# Patient Record
Sex: Female | Born: 1980 | Race: White | Hispanic: No | Marital: Married | Smoking: Never smoker
Health system: Southern US, Community
[De-identification: ages and names within clinical notes are randomized; demographics above are authoritative.]

## PROBLEM LIST (undated history)

## (undated) DIAGNOSIS — F329 Major depressive disorder, single episode, unspecified: Secondary | ICD-10-CM

## (undated) DIAGNOSIS — F32A Depression, unspecified: Secondary | ICD-10-CM

## (undated) HISTORY — DX: Major depressive disorder, single episode, unspecified: F32.9

## (undated) HISTORY — DX: Depression, unspecified: F32.A

---

## 2018-09-12 ENCOUNTER — Telehealth: Payer: Self-pay

## 2018-09-12 NOTE — Telephone Encounter (Signed)
Spoke with the pt and scheduled consult with RB for Monday 3/16 at 9:30 am  Advised to arrive at 9:15 am and she was given new office location  She will request a disc with her cxr from [facility] to bring with her

## 2018-09-15 ENCOUNTER — Ambulatory Visit (INDEPENDENT_AMBULATORY_CARE_PROVIDER_SITE_OTHER): Payer: 59 | Admitting: Emergency Medicine

## 2018-09-15 ENCOUNTER — Ambulatory Visit (INDEPENDENT_AMBULATORY_CARE_PROVIDER_SITE_OTHER): Payer: 59

## 2018-09-15 ENCOUNTER — Other Ambulatory Visit: Payer: Self-pay

## 2018-09-15 ENCOUNTER — Encounter: Payer: Self-pay | Admitting: Emergency Medicine

## 2018-09-15 VITALS — BP 120/86 | HR 60 | Ht 69.0 in | Wt 183.0 lb

## 2018-09-15 DIAGNOSIS — J189 Pneumonia, unspecified organism: Secondary | ICD-10-CM | POA: Insufficient documentation

## 2018-09-15 DIAGNOSIS — R05 Cough: Secondary | ICD-10-CM | POA: Diagnosis not present

## 2018-09-15 DIAGNOSIS — J181 Lobar pneumonia, unspecified organism: Secondary | ICD-10-CM | POA: Diagnosis not present

## 2018-09-15 DIAGNOSIS — R053 Chronic cough: Secondary | ICD-10-CM | POA: Insufficient documentation

## 2018-09-15 MED ORDER — OMEPRAZOLE 20 MG PO CPDR: 20.0000 mg | DELAYED_RELEASE_CAPSULE | Freq: Every day | ORAL | 5 refills | Status: AC

## 2018-09-15 NOTE — Patient Instructions (Signed)
Please complete your doxycycline as ordered. Chest x-ray today. We will try to obtain a copy of the chest x-ray that was done at [facility] urgent care for comparison Please start omeprazole 20 mg.  Take twice daily for 1 week then decrease to once daily.  Try to take this 1 hour around food. Follow-up in 4 to 6 weeks with a chest x-ray on the same day.

## 2018-09-15 NOTE — Assessment & Plan Note (Signed)
Persistent cough since she developed a viral syndrome that was likely flu B in the beginning of February (exposed from her child).  She was treated with Tamiflu, did improve some overall but continued to have dry cough.  She then has had a second increasing cough, sputum production that sounds consistent with a right lower lobe community-acquired pneumonia based on her report regarding the chest x-ray that was done on 09/11/2018 with [facility].  She is on antibiotics as below.  I think will be reasonable to try and treat any other potential contributors.  The main 1 that she identifies is some occasional GERD.  We will add omeprazole to her regimen as she finishes her antibiotic treatment.

## 2018-09-15 NOTE — Assessment & Plan Note (Signed)
Repeat her chest x-ray today.  We will try to get a copy of the chest x-ray that was done at [facility].  The disc copy that she brought would not open.  Agree with continuing and completing her prescription for doxycycline.  She will need a repeat chest x-ray in about 4 to 6 weeks to look for clearance of any present infiltrate.  I will follow this with her.  If there continues to be any abnormality on chest x-ray then we will perform a CT scan of the chest to investigate further.

## 2018-09-15 NOTE — Progress Notes (Signed)
Subjective:    Patient ID: Kristina Lindsey, female    DOB: 1980/12/06, 38 y.o.   MRN: [id number]  HPI 38 year old woman, never smoker with history of depression, hx allergies, GERD, little other significant past medical history.  She began to experience symptoms of fever, a lot of cough prod of colored mucous in early February. Had a Flu B exposure, treated w tamiflu. Continued to have cough with white mucous, was treated with prednisone was tried on azithro with partial improvement in cough. Continued to feel ill, CXR 3/12 with ? R sided infiltrate > film unavailable right now.   She was Seen at [facility] urgent care, treated for possible flulike illness and then subsequently a possible bacterial bronchitis with antibiotics.   Review of Systems  Constitutional: Negative for activity change, appetite change, chills, diaphoresis, fatigue, fever and unexpected weight change.  HENT: Negative for congestion, dental problem, nosebleeds, postnasal drip, rhinorrhea, sinus pressure, sneezing, trouble swallowing and voice change.   Eyes: Negative for itching and visual disturbance.  Respiratory: Negative for cough, choking, chest tightness, shortness of breath, wheezing and stridor.   Cardiovascular: Negative for chest pain, palpitations and leg swelling.  Gastrointestinal: Negative for abdominal pain.  Musculoskeletal: Negative for joint swelling and myalgias.  Skin: Negative for rash.  Neurological: Negative for syncope, light-headedness and headaches.  Psychiatric/Behavioral: Negative for sleep disturbance.      Past Medical History:  Diagnosis Date  . Depression      No family history on file.   Social History   Socioeconomic History  . Marital status: Married    Spouse name: Not on file  . Number of children: Not on file  . Years of education: Not on file  . Highest education level: Not on file  Occupational History  . Not on file  Social Needs  . Financial resource strain: Not  on file  . Food insecurity:    Worry: Not on file    Inability: Not on file  . Transportation needs:    Medical: Not on file    Non-medical: Not on file  Tobacco Use  . Smoking status: Never Smoker  . Smokeless tobacco: Never Used  Substance and Sexual Activity  . Alcohol use: Not on file  . Drug use: Not on file  . Sexual activity: Not on file  Lifestyle  . Physical activity:    Days per week: Not on file    Minutes per session: Not on file  . Stress: Not on file  Relationships  . Social connections:    Talks on phone: Not on file    Gets together: Not on file    Attends religious service: Not on file    Active member of club or organization: Not on file    Attends meetings of clubs or organizations: Not on file    Relationship status: Not on file  . Intimate partner violence:    Fear of current or ex partner: Not on file    Emotionally abused: Not on file    Physically abused: Not on file    Forced sexual activity: Not on file  Other Topics Concern  . Not on file  Social History Narrative  . 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   No Known Allergies   Outpatient Medications Prior to Visit  Medication Sig Dispense Refill  . doxycycline (DORYX) 100 MG EC tablet Take 100 mg by mouth 2 (two) times daily.    Marland Kitchen  FLUoxetine (PROZAC) 40 MG capsule Take 40 mg by mouth daily.     No facility-administered medications prior to visit.         Objective:   Physical Exam  Vitals:   09/15/18 0935  BP: 120/86  Pulse: 60  SpO2: 100%  Weight: 183 lb (83 kg)  Height: 5\' 9"  (1.753 m)   Gen: Pleasant, well-nourished, in no distress,  normal affect  ENT: No lesions,  mouth clear,  oropharynx clear, no postnasal drip  Neck: No JVD, no stridor  Lungs: No use of accessory muscles, no crackles or wheezing on normal respiration, no wheeze on forced expiration  Cardiovascular: RRR, heart sounds normal, no murmur or gallops, no  peripheral edema  Musculoskeletal: No deformities, no cyanosis or clubbing  Neuro: alert, awake, non focal  Skin: Warm, no lesions or rash      Assessment & Plan:  Chronic cough Persistent cough since she developed a viral syndrome that was likely flu B in the beginning of February (exposed from her child).  She was treated with Tamiflu, did improve some overall but continued to have dry cough.  She then has had a second increasing cough, sputum production that sounds consistent with a right lower lobe community-acquired pneumonia based on her report regarding the chest x-ray that was done on 09/11/2018 with [facility].  She is on antibiotics as below.  I think will be reasonable to try and treat any other potential contributors.  The main 1 that she identifies is some occasional GERD.  We will add omeprazole to her regimen as she finishes her antibiotic treatment.  Community acquired pneumonia Repeat her chest x-ray today.  We will try to get a copy of the chest x-ray that was done at [facility].  The disc copy that she brought would not open.  Agree with continuing and completing her prescription for doxycycline.  She will need a repeat chest x-ray in about 4 to 6 weeks to look for clearance of any present infiltrate.  I will follow this with her.  If there continues to be any abnormality on chest x-ray then we will perform a CT scan of the chest to investigate further.    Levy Pupa, MD, PhD 09/15/2018, 2:44 PM [facility] [phone] or if no answer [phone]

## 2018-10-17 ENCOUNTER — Ambulatory Visit (INDEPENDENT_AMBULATORY_CARE_PROVIDER_SITE_OTHER): Payer: 59 | Admitting: Primary Care

## 2018-10-17 ENCOUNTER — Ambulatory Visit: Payer: 59 | Admitting: Emergency Medicine

## 2018-10-17 ENCOUNTER — Encounter: Payer: Self-pay | Admitting: Primary Care

## 2018-10-17 ENCOUNTER — Other Ambulatory Visit: Payer: Self-pay

## 2018-10-17 DIAGNOSIS — J189 Pneumonia, unspecified organism: Secondary | ICD-10-CM | POA: Diagnosis not present

## 2018-10-17 NOTE — Progress Notes (Signed)
Virtual Visit via Telephone Note  I connected with Kristina Lindsey on 10/17/18 at  9:30 AM EDT by telephone and verified that I am speaking with the correct person using two identifiers.   I discussed the limitations, risks, security and privacy concerns of performing an evaluation and management service by telephone and the availability of in person appointments. I also discussed with the patient that there may be a patient responsible charge related to this service. The patient expressed understanding and agreed to proceed.   History of Present Illness: 38 year old female, never smoked. PMH significant for CAP, allergies, GERD. Patient of Dr. Delton Coombes, seen for initial consult on 09/15/18.   Patient began to experience cough in February with colored mucus. Positive influenza B, treated with tamiflu. Cough treated with prednisone and azithromycin with partial improvement. CXR on 3/12 showed ? right sided infiltrate- film unavailable from [facility] urgent care. Symptoms and CXR consistent with CAP. Given doxycycline course. Repeat CXR on 3/16 with [place] [facility] normal. Added omeprazole.  10/17/2018 Patient called today for 4 week follow-up. Reports that she is doing well, cough has mostly gone away after completing two rounds of antibiotics. Cough has come back some with allergies. Continues taking Zyrtec 10mg  daily. Denies acid reflux symptoms.   Observations/Objective:  - NO shortness of breath, wheezing or cough observed during phone conversation  Assessment and Plan:  CAP - Improved - Completed azithromycin and doxycycline  - Repeat CXR on 3/16 clear, no need for follow-up  Seasonal allergies - Continue Zyrtec 10mg  daily - Add Flonase nasal spray   GERD - No active symptoms - Ok to trial off omeprazole, if symptoms return restart  - Follows GERD diet   Follow Up Instructions:   - Return as needed with Dr. Delton Coombes if symptoms return   I discussed the assessment and treatment plan  with the patient. The patient was provided an opportunity to ask questions and all were answered. The patient agreed with the plan and demonstrated an understanding of the instructions.   The patient was advised to call back or seek an in-person evaluation if the symptoms worsen or if the condition fails to improve as anticipated.  I provided 15 minutes of non-face-to-face time during this encounter.   Kristina Bayley, NP

## 2018-10-17 NOTE — Patient Instructions (Signed)
Community acquired pneumonia: - Improved - Completed azithromycin and doxycycline  - Repeat CXR on 3/16 clear, no need for follow-up  Seasonal allergies/post-nasal drip: - Continue Zyrtec  daily - Add Flonase nasal spray   GERD: - No active symptoms - Ok to trial off omeprazole, if symptoms return restart  - Follows GERD diet   Follow-up: Return as needed with Dr. Delton Coombes or NP if symptoms return    Food Choices for Gastroesophageal Reflux Disease, Adult When you have gastroesophageal reflux disease (GERD), the foods you eat and your eating habits are very important. Choosing the right foods can help ease your discomfort. Think about working with a nutrition specialist (dietitian) to help you make good choices. What are tips for following this plan?  Meals  Choose healthy foods that are low in fat, such as fruits, vegetables, whole grains, low-fat dairy products, and lean meat, fish, and poultry.  Eat small meals often instead of 3 large meals a day. Eat your meals slowly, and in a place where you are relaxed. Avoid bending over or lying down until 2-3 hours after eating.  Avoid eating meals 2-3 hours before bed.  Avoid drinking a lot of liquid with meals.  Cook foods using methods other than frying. Bake, grill, or broil food instead.  Avoid or limit: ? Chocolate. ? Peppermint or spearmint. ? Alcohol. ? Pepper. ? Black and decaffeinated coffee. ? Black and decaffeinated tea. ? Bubbly (carbonated) soft drinks. ? Caffeinated energy drinks and soft drinks.  Limit high-fat foods such as: ? Fatty meat or fried foods. ? Whole milk, cream, butter, or ice cream. ? Nuts and nut butters. ? Pastries, donuts, and sweets made with butter or shortening.  Avoid foods that cause symptoms. These foods may be different for everyone. Common foods that cause symptoms include: ? Tomatoes. ? Oranges, lemons, and limes. ? Peppers. ? Spicy food. ? Onions and garlic. ? Vinegar.  Lifestyle  Maintain a healthy weight. Ask your doctor what weight is healthy for you. If you need to lose weight, work with your doctor to do so safely.  Exercise for at least 30 minutes for 5 or more days each week, or as told by your doctor.  Wear loose-fitting clothes.  Do not smoke. If you need help quitting, ask your doctor.  Sleep with the head of your bed higher than your feet. Use a wedge under the mattress or blocks under the bed frame to raise the head of the bed. Summary  When you have gastroesophageal reflux disease (GERD), food and lifestyle choices are very important in easing your symptoms.  Eat small meals often instead of 3 large meals a day. Eat your meals slowly, and in a place where you are relaxed.  Limit high-fat foods such as fatty meat or fried foods.  Avoid bending over or lying down until 2-3 hours after eating.  Avoid peppermint and spearmint, caffeine, alcohol, and chocolate. This information is not intended to replace advice given to you by your health care provider. Make sure you discuss any questions you have with your health care provider. Document Released: 12/18/2011 Document Revised: 07/24/2016 Document Reviewed: 07/24/2016 Elsevier Interactive Patient Education  2019 [employer] Inc[employer]    Allergic Rhinitis, Adult Allergic rhinitis is a reaction to allergens in the air. Allergens are tiny specks (particles) in the air that cause your body to have an allergic reaction. This condition cannot be passed from person to person (is not contagious). Allergic rhinitis cannot be cured, but it  can be controlled. There are two types of allergic rhinitis:  Seasonal. This type is also called hay fever. It happens only during certain times of the year.  Perennial. This type can happen at any time of the year. What are the causes? This condition may be caused by:  Pollen from grasses, trees, and weeds.  House dust mites.  Pet dander.  Mold. What are the  signs or symptoms? Symptoms of this condition include:  Sneezing.  Runny or stuffy nose (nasal congestion).  A lot of mucus in the back of the throat (postnasal drip).  Itchy nose.  Tearing of the eyes.  Trouble sleeping.  Being sleepy during day. How is this treated? There is no cure for this condition. You should avoid things that trigger your symptoms (allergens). Treatment can help to relieve symptoms. This may include:  Medicines that block allergy symptoms, such as antihistamines. These may be given as a shot, nasal spray, or pill.  Shots that are given until your body becomes less sensitive to the allergen (desensitization).  Stronger medicines, if all other treatments have not worked. Follow these instructions at home: Avoiding allergens   Find out what you are allergic to. Common allergens include smoke, dust, and pollen.  Avoid them if you can. These are some of the things that you can do to avoid allergens: ? Replace carpet with wood, tile, or vinyl flooring. Carpet can trap dander and dust. ? Clean any mold found in the home. ? Do not smoke. Do not allow smoking in your home. ? Change your heating and air conditioning filter at least once a month. ? During allergy season:  Keep windows closed as much as you can. If possible, use air conditioning when there is a lot of pollen in the air.  Use a special filter for allergies with your furnace and air conditioner.  Plan outdoor activities when pollen counts are lowest. This is usually during the early morning or evening hours.  If you do go outdoors when pollen count is high, wear a special mask for people with allergies.  When you come indoors, take a shower and change your clothes before sitting on furniture or bedding. General instructions  Do not use fans in your home.  Do not hang clothes outside to dry.  Wear sunglasses to keep pollen out of your eyes.  Wash your hands right away after you touch  household pets.  Take over-the-counter and prescription medicines only as told by your doctor.  Keep all follow-up visits as told by your doctor. This is important. Contact a doctor if:  You have a fever.  You have a cough that does not go away (is persistent).  You start to make whistling sounds when you breathe (wheeze).  Your symptoms do not get better with treatment.  You have thick fluid coming from your nose.  You start to have nosebleeds. Get help right away if:  Your tongue or your lips are swollen.  You have trouble breathing.  You feel dizzy or you feel like you are going to pass out (faint).  You have cold sweats. Summary  Allergic rhinitis is a reaction to allergens in the air.  This condition may be caused by allergens. These include pollen, dust mites, pet dander, and mold.  Symptoms include a runny, itchy nose, sneezing, or tearing eyes. You may also have trouble sleeping or feel sleepy during the day.  Treatment includes taking medicines and avoiding allergens. You may also get shots  or take stronger medicines.  Get help if you have a fever or a cough that does not stop. Get help right away if you are short of breath. This information is not intended to replace advice given to you by your health care provider. Make sure you discuss any questions you have with your health care provider. Document Released: 10/18/2010 Document Revised: 01/07/2018 Document Reviewed: 01/07/2018 Elsevier Interactive Patient Education  2019 [employer].

## 2020-09-04 IMAGING — DX CHEST - 2 VIEW
2 series · 2 of 2 positions shown · non-contrast
Comparison: None.

CLINICAL DATA: Productive cough 6 weeks.  Rule out pneumonia.

EXAM:
CHEST - 2 VIEW

[chest pa]
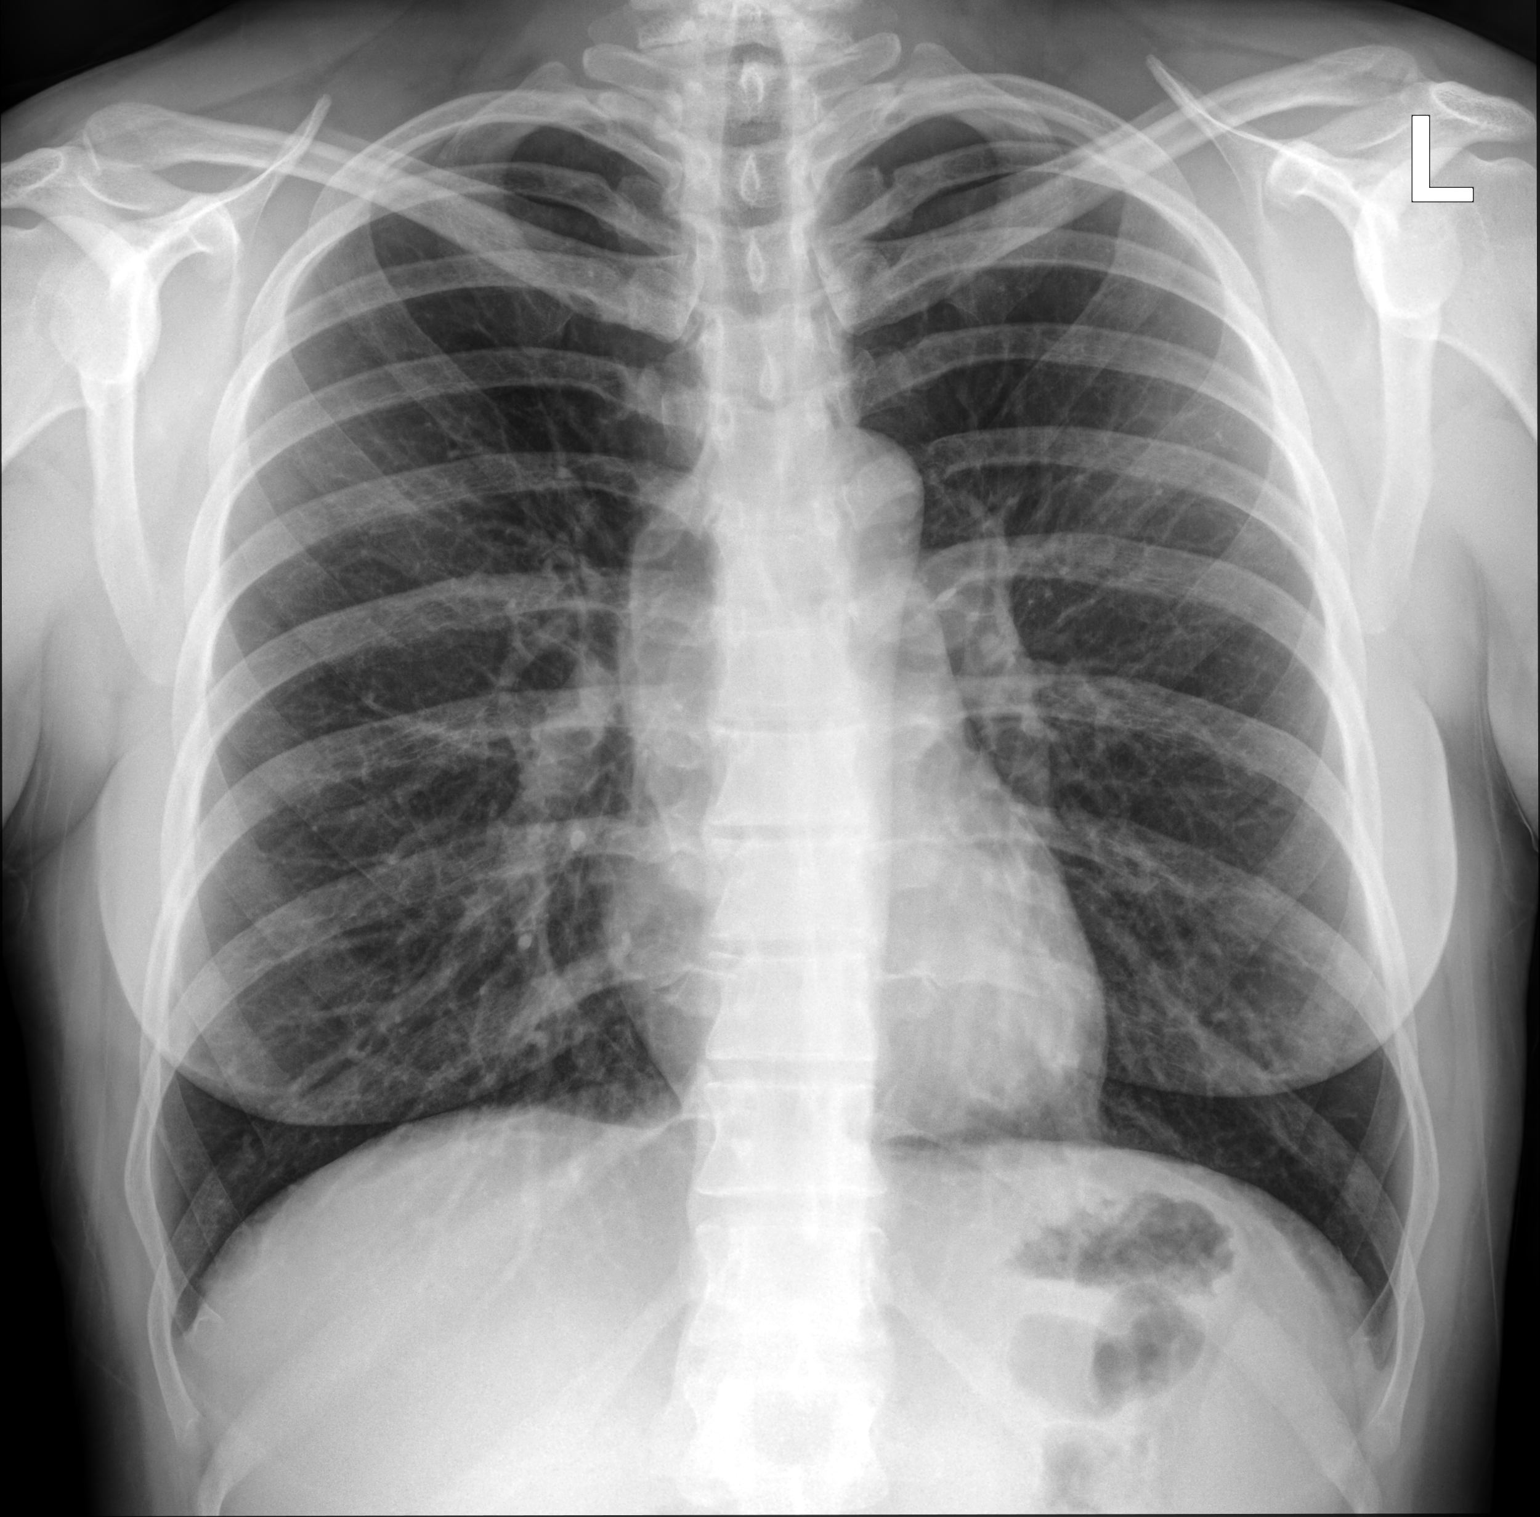

[chest lat]
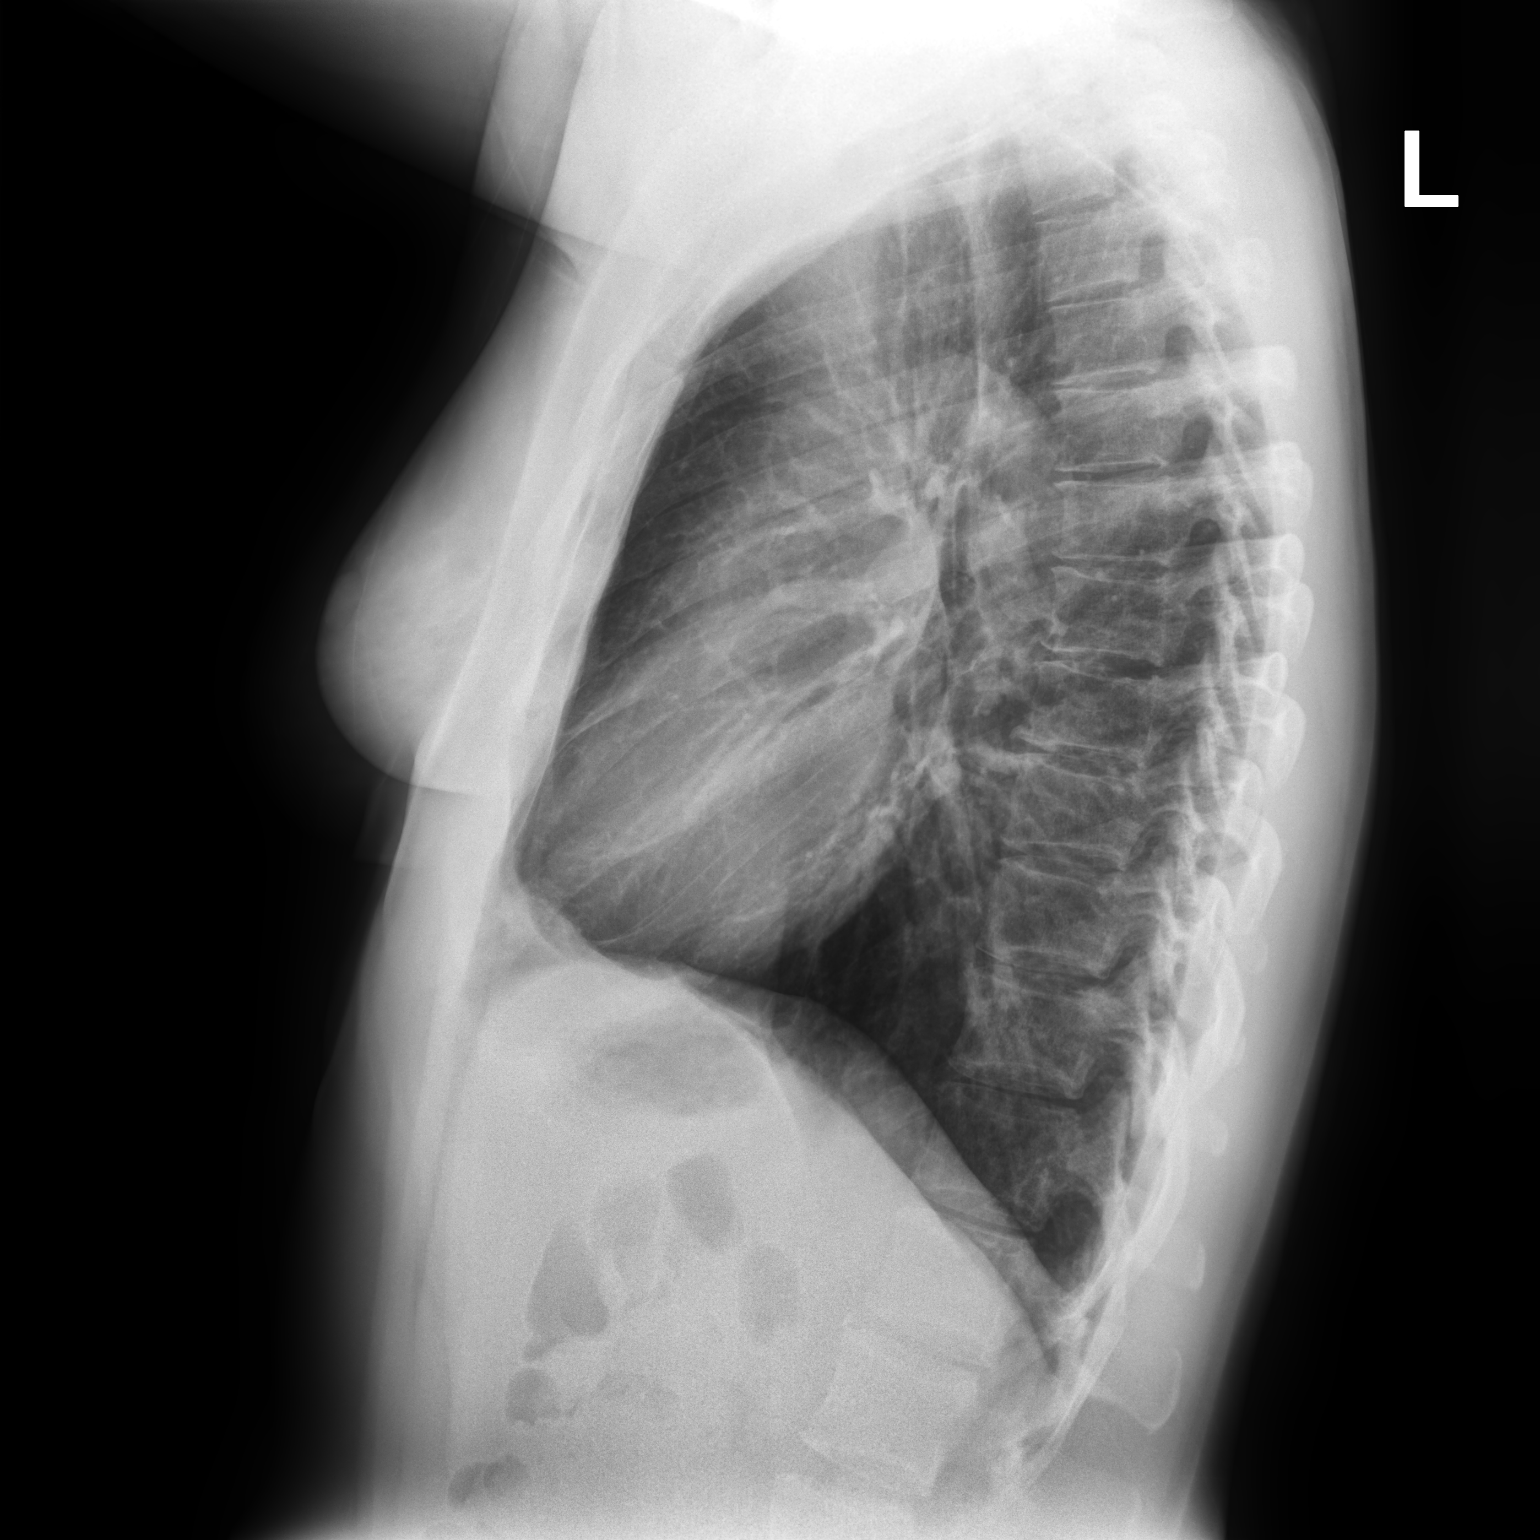

[2 of 2 positions shown; findings below may reference images not displayed]

FINDINGS: The heart size and mediastinal contours are within normal limits.
Both lungs are clear. The visualized skeletal structures are
unremarkable.
IMPRESSION: No active cardiopulmonary disease.
